# Patient Record
Sex: Male | Born: 1985 | Race: White | Hispanic: No | Marital: Single | Smoking: Current every day smoker
Health system: Southern US, Community
[De-identification: ages and names within clinical notes are randomized; demographics above are authoritative.]

## PROBLEM LIST (undated history)

## (undated) DIAGNOSIS — N200 Calculus of kidney: Secondary | ICD-10-CM

---

## 2016-05-12 ENCOUNTER — Emergency Department (HOSPITAL_COMMUNITY)
Admission: EM | Admit: 2016-05-12 | Discharge: 2016-05-12 | Disposition: A | Discharge status: Home / Self Care | Payer: Self-pay | Attending: Emergency Medicine | Admitting: Emergency Medicine

## 2016-05-12 ENCOUNTER — Emergency Department (HOSPITAL_COMMUNITY): Payer: Self-pay

## 2016-05-12 ENCOUNTER — Encounter (HOSPITAL_COMMUNITY): Payer: Self-pay | Admitting: Emergency Medicine

## 2016-05-12 DIAGNOSIS — F17200 Nicotine dependence, unspecified, uncomplicated: Secondary | ICD-10-CM | POA: Insufficient documentation

## 2016-05-12 DIAGNOSIS — Z79899 Other long term (current) drug therapy: Secondary | ICD-10-CM | POA: Insufficient documentation

## 2016-05-12 DIAGNOSIS — R109 Unspecified abdominal pain: Secondary | ICD-10-CM | POA: Insufficient documentation

## 2016-05-12 DIAGNOSIS — R319 Hematuria, unspecified: Secondary | ICD-10-CM | POA: Insufficient documentation

## 2016-05-12 HISTORY — DX: Calculus of kidney: N20.0

## 2016-05-12 LAB — BASIC METABOLIC PANEL
ANION GAP: 7 (ref 5–15)
BUN: 16 mg/dL (ref 6–20)
CALCIUM: 9.3 mg/dL (ref 8.9–10.3)
CHLORIDE: 105 mmol/L (ref 101–111)
CO2: 26 mmol/L (ref 22–32)
Creatinine, Ser: 0.74 mg/dL (ref 0.61–1.24)
GFR calc non Af Amer: >60 mL/min (ref >60)
GLUCOSE: 101 mg/dL — AB (ref 65–99)
Potassium: 3.7 mmol/L (ref 3.5–5.1)
Sodium: 138 mmol/L (ref 135–145)

## 2016-05-12 LAB — URINALYSIS, ROUTINE W REFLEX MICROSCOPIC
BACTERIA UA: NONE SEEN
Bilirubin Urine: NEGATIVE
GLUCOSE, UA: NEGATIVE mg/dL
Ketones, ur: NEGATIVE mg/dL
Leukocytes, UA: NEGATIVE
Nitrite: NEGATIVE
PROTEIN: NEGATIVE mg/dL
SPECIFIC GRAVITY, URINE: 1.024 (ref 1.005–1.030)
pH: 6 (ref 5.0–8.0)

## 2016-05-12 LAB — CBC
HEMATOCRIT: 43.8 % (ref 39.0–52.0)
HEMOGLOBIN: 15.2 g/dL (ref 13.0–17.0)
MCH: 30.7 pg (ref 26.0–34.0)
MCHC: 34.7 g/dL (ref 30.0–36.0)
MCV: 88.5 fL (ref 78.0–100.0)
Platelets: 330 10*3/uL (ref 150–400)
RBC: 4.95 MIL/uL (ref 4.22–5.81)
RDW: 12.7 % (ref 11.5–15.5)
WBC: 10.4 10*3/uL (ref 4.0–10.5)

## 2016-05-12 MED ORDER — KETOROLAC TROMETHAMINE 30 MG/ML IJ SOLN
15.0000 mg | Freq: Once | INTRAMUSCULAR | Status: AC
  Administered 2016-05-12: 15 mg via INTRAMUSCULAR
  Filled 2016-05-12: qty 1

## 2016-05-12 MED ORDER — NAPROXEN 500 MG PO TABS: 500.0000 mg | ORAL_TABLET | Freq: Two times a day (BID) | ORAL | 0 refills | Status: AC

## 2016-05-12 NOTE — ED Provider Notes (Signed)
[facility]-EMERGENCY DEPT Provider Note   CSN: [id number] Arrival date & time: 05/12/16  1502     History   Chief Complaint Chief Complaint  Patient presents with  . Flank Pain    HPI Carl Terrell is a 31 y.o. male.  HPI  Patient presents with concern of right-sided flank pain. Onset was yesterday, without clear precipitant. Since yesterday the patient had crampy, intermittent pain in the right flank, radiating towards the right inguinal crease. No scrotal changes, the penis pain. Minimal dysuria, no gross hematuria. Pain is not relieved with anything, is brief, eases spontaneously. Patient states that he may have a history of kidney stone in the distant past, denies history of kidney infection or other infections. Patient states that he was well prior to the onset of this illness.   Past Medical History:  Diagnosis Date  . Kidney calculi     There are no active problems to display for this patient.   History reviewed. No pertinent surgical history.     Home Medications    Prior to Admission medications   Not on File    Family History History reviewed. No pertinent family history.  Social History Social History  Substance Use Topics  . Smoking status: Current Every Day Smoker    Packs/day: 0.50  . Smokeless tobacco: Never Used  . Alcohol use No     Allergies   Ceclor [cefaclor]   Review of Systems Review of Systems  Constitutional:       Per HPI, otherwise negative  HENT:       Per HPI, otherwise negative  Respiratory:       Per HPI, otherwise negative  Cardiovascular:       Per HPI, otherwise negative  Gastrointestinal: Negative for vomiting.  Endocrine:       Negative aside from HPI  Genitourinary:       Neg aside from HPI   Musculoskeletal:       Per HPI, otherwise negative  Skin: Negative.   Neurological: Negative for syncope.     Physical Exam Updated Vital Signs BP 107/69 (BP Location: Left Arm)   Pulse 73   Temp 98.6 F (37  C) (Oral)   Resp 18   Ht 5\' 7"  (1.702 m)   Wt 169 lb (76.7 kg)   SpO2 99%   BMI 26.47 kg/m   Physical Exam  Constitutional: He is oriented to person, place, and time. He appears well-developed. No distress.  HENT:  Head: Normocephalic and atraumatic.  Eyes: Conjunctivae and EOM are normal.  Cardiovascular: Normal rate and regular rhythm.   Pulmonary/Chest: Effort normal. No stridor. No respiratory distress.  Abdominal: He exhibits no distension. There is no tenderness.  Musculoskeletal: He exhibits no edema.  Neurological: He is alert and oriented to person, place, and time.  Skin: Skin is warm and dry.  Psychiatric: He has a normal mood and affect.  Nursing note and vitals reviewed.    ED Treatments / Results  Labs (all labs ordered are listed, but only abnormal results are displayed) Labs Reviewed  URINALYSIS, ROUTINE W REFLEX MICROSCOPIC - Abnormal; Notable for the following:       Result Value   APPearance HAZY (*)    Hgb urine dipstick LARGE (*)    All other components within normal limits  BASIC METABOLIC PANEL - Abnormal; Notable for the following:    Glucose, Bld 101 (*)    All other components within normal limits  CBC    Radiology  Ct Renal Stone Study  Result Date: 05/12/2016 CLINICAL DATA:  Right-sided flank pain with urinary retention. History of renal stones. EXAM: CT ABDOMEN AND PELVIS WITHOUT CONTRAST TECHNIQUE: Multidetector CT imaging of the abdomen and pelvis was performed following the standard protocol without IV contrast. COMPARISON:  Prior CT and abdomen radiographs from 12/31/2010 have no archived images for comparison. FINDINGS: Lower chest: No acute abnormality. Dependent atelectasis at each lung base. Hepatobiliary: No focal liver abnormality is seen. No gallstones, gallbladder wall thickening, or biliary dilatation. Pancreas: Unremarkable. No pancreatic ductal dilatation or surrounding inflammatory changes. Spleen: Normal in size without focal  abnormality. Adrenals/Urinary Tract: Adrenal glands are unremarkable. Kidneys are normal, without renal calculi, focal lesion, or hydronephrosis. Bladder is unremarkable. Stomach/Bowel: Stomach is within normal limits. Appendix appears normal. No evidence of bowel wall thickening, distention, or inflammatory changes. Large amount of fecal rib residue noted within large bowel from cecum through splenic flexure. Vascular/Lymphatic: No significant vascular findings are present. No enlarged abdominal or pelvic lymph nodes. Several small mesenteric lymph nodes are identified in the right lower quadrant adjacent to the cecum consistent with mesenteric adenitis. Reproductive: Prostate is unremarkable. Other: Tiny fat containing umbilical hernia. No abdominopelvic ascites or pneumoperitoneum. Musculoskeletal: Mild broad-based central disc herniation with disc space narrowing at L5-S1. No acute osseous abnormality. IMPRESSION: 1. Small right lower quadrant mesenteric lymph nodes in keeping with mesenteric adenitis. No nephrolithiasis nor obstructive uropathy. 2. Mild broad-based central disc herniation L5-S1. 3. Moderate fecal retention from cecum through the splenic flexure. Electronically Signed   By: Tollie Eth M.D.   On: 05/12/2016 19:05    Procedures Procedures (including critical care time)  Medications Ordered in ED Medications  ketorolac (TORADOL) 30 MG/ML injection 15 mg (15 mg Intramuscular Given 05/12/16 1854)     Initial Impression / Assessment and Plan / ED Course  I have reviewed the triage vital signs and the nursing notes.  Pertinent labs & imaging results that were available during my care of the patient were reviewed by me and considered in my medical decision making (see chart for details).  Update: On repeat exam the patient is in no distress. I discussed all findings with patient and his girlfriend. With this history, including hematuria, suspicion is for a passed kidney stone. CT  interpreted as possible mesenteric adenitis, patient has no other abdominal pain, minimal discomfort.  This is likely reactive. Patient understands all findings, will start anti-inflammatories, follow up with primary care next week.  Final Clinical Impressions(s) / ED Diagnoses  Abdominal pain  Hematuria   Gerhard Munch, MD 05/12/16 2022

## 2016-05-12 NOTE — Discharge Instructions (Signed)
As discussed, your evaluation today has been largely reassuring.  But, it is important that you monitor your condition carefully, and do not hesitate to return to the ED if you develop new, or concerning changes in your condition.  Your symptoms are likely due to a recently passed kidney stone.  Please follow-up with your physician for appropriate ongoing care.

## 2016-05-12 NOTE — ED Triage Notes (Signed)
PT c/o right sided flank pain with some urinary retention that started x1 day ago.

## 2016-10-19 ENCOUNTER — Emergency Department (HOSPITAL_COMMUNITY): Payer: PRIVATE HEALTH INSURANCE

## 2016-10-19 ENCOUNTER — Encounter (HOSPITAL_COMMUNITY): Payer: Self-pay | Admitting: Cardiology

## 2016-10-19 ENCOUNTER — Emergency Department (HOSPITAL_COMMUNITY)
Admission: EM | Admit: 2016-10-19 | Discharge: 2016-10-19 | Disposition: A | Discharge status: Home / Self Care | Payer: PRIVATE HEALTH INSURANCE | Attending: Emergency Medicine | Admitting: Emergency Medicine

## 2016-10-19 DIAGNOSIS — Y929 Unspecified place or not applicable: Secondary | ICD-10-CM | POA: Insufficient documentation

## 2016-10-19 DIAGNOSIS — Y999 Unspecified external cause status: Secondary | ICD-10-CM | POA: Insufficient documentation

## 2016-10-19 DIAGNOSIS — T1490XA Injury, unspecified, initial encounter: Secondary | ICD-10-CM

## 2016-10-19 DIAGNOSIS — F17210 Nicotine dependence, cigarettes, uncomplicated: Secondary | ICD-10-CM | POA: Diagnosis not present

## 2016-10-19 DIAGNOSIS — Y939 Activity, unspecified: Secondary | ICD-10-CM | POA: Insufficient documentation

## 2016-10-19 DIAGNOSIS — S9031XA Contusion of right foot, initial encounter: Secondary | ICD-10-CM | POA: Diagnosis not present

## 2016-10-19 DIAGNOSIS — S99921A Unspecified injury of right foot, initial encounter: Secondary | ICD-10-CM | POA: Diagnosis present
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
## 2016-10-19 NOTE — Discharge Instructions (Signed)
Your xray is negative for fracture or dislocation. Please apply ice to your foot today and tomorrow. Keep your foot elevated above your waist. Use the postoperative shoe until you can safely use your regular shoes. Please see Dr. Romeo AppleHarrison for orthopedic evaluation if not improving.

## 2017-04-03 ENCOUNTER — Emergency Department (HOSPITAL_COMMUNITY): Payer: No Typology Code available for payment source

## 2017-04-03 ENCOUNTER — Encounter (HOSPITAL_COMMUNITY): Payer: Self-pay

## 2017-04-03 ENCOUNTER — Other Ambulatory Visit: Payer: Self-pay

## 2017-04-03 ENCOUNTER — Emergency Department (HOSPITAL_COMMUNITY)
Admission: EM | Admit: 2017-04-03 | Discharge: 2017-04-03 | Disposition: A | Discharge status: Home / Self Care | Payer: No Typology Code available for payment source | Attending: Emergency Medicine | Admitting: Emergency Medicine

## 2017-04-03 DIAGNOSIS — Y999 Unspecified external cause status: Secondary | ICD-10-CM | POA: Insufficient documentation

## 2017-04-03 DIAGNOSIS — S4991XA Unspecified injury of right shoulder and upper arm, initial encounter: Secondary | ICD-10-CM | POA: Insufficient documentation

## 2017-04-03 DIAGNOSIS — Y9389 Activity, other specified: Secondary | ICD-10-CM | POA: Diagnosis not present

## 2017-04-03 DIAGNOSIS — F17210 Nicotine dependence, cigarettes, uncomplicated: Secondary | ICD-10-CM | POA: Insufficient documentation

## 2017-04-03 DIAGNOSIS — Y9289 Other specified places as the place of occurrence of the external cause: Secondary | ICD-10-CM | POA: Diagnosis not present

## 2017-04-03 MED ORDER — MELOXICAM 15 MG PO TABS: 15.0000 mg | ORAL_TABLET | Freq: Every day | ORAL | 0 refills | Status: AC

## 2017-04-03 NOTE — Discharge Instructions (Signed)
Ice your shoulder several times a day. Mobic for pain and inflammation. No heavy lifting or strenuous activity for 1-2 weeks. Follow up with family doctor or sports medicine if not improving.
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
## 2017-04-03 NOTE — ED Notes (Signed)
Patient transported to X-ray 

## 2017-04-15 ENCOUNTER — Emergency Department (HOSPITAL_COMMUNITY)
Admission: EM | Admit: 2017-04-15 | Discharge: 2017-04-15 | Disposition: A | Discharge status: Home / Self Care | Payer: Self-pay | Attending: Emergency Medicine | Admitting: Emergency Medicine

## 2017-04-15 ENCOUNTER — Other Ambulatory Visit: Payer: Self-pay

## 2017-04-15 ENCOUNTER — Encounter (HOSPITAL_COMMUNITY): Payer: Self-pay | Admitting: *Deleted

## 2017-04-15 ENCOUNTER — Emergency Department (HOSPITAL_COMMUNITY): Payer: Self-pay

## 2017-04-15 DIAGNOSIS — J101 Influenza due to other identified influenza virus with other respiratory manifestations: Secondary | ICD-10-CM | POA: Insufficient documentation

## 2017-04-15 DIAGNOSIS — F17200 Nicotine dependence, unspecified, uncomplicated: Secondary | ICD-10-CM | POA: Insufficient documentation

## 2017-04-15 DIAGNOSIS — J111 Influenza due to unidentified influenza virus with other respiratory manifestations: Secondary | ICD-10-CM

## 2017-04-15 LAB — INFLUENZA PANEL BY PCR (TYPE A & B)
INFLBPCR: NEGATIVE
Influenza A By PCR: POSITIVE — AB

## 2017-04-15 MED ORDER — BENZONATATE 100 MG PO CAPS
200.0000 mg | ORAL_CAPSULE | Freq: Once | ORAL | Status: AC
  Administered 2017-04-15: 200 mg via ORAL
  Filled 2017-04-15: qty 2

## 2017-04-15 MED ORDER — BENZONATATE 100 MG PO CAPS: 200.0000 mg | ORAL_CAPSULE | Freq: Three times a day (TID) | ORAL | 0 refills | Status: AC | PRN

## 2017-04-15 NOTE — Discharge Instructions (Signed)
Rest,  Drink plenty of fluids.  Take motrin or tylenol for achiness and fever reduction.   Get rechecked for increased shortness of breath,  Increased fever or increasing weakness. ° °

## 2017-04-15 NOTE — ED Provider Notes (Signed)
[facility] EMERGENCY DEPARTMENT Provider Note   CSN: [id number] Arrival date & time: 04/15/17  1952     History   Chief Complaint Chief Complaint  Patient presents with  . Fever    HPI Carl Terrell is a 32 y.o. male presenting with a 4 day history of flulike symptoms including intermittent fever with maximum temperature of 102.4, nonproductive cough, nasal congestion and nausea with emesis, stating that he has had 3 episodes of vomiting today.  He denies abdominal pain, chest pain, shortness of breath and diarrhea.  He suspected he had the flu and stated he was actually feeling better when he woke this morning but then this afternoon his fever returned along with worsening cough.  He has taken an OTC cough syrup along with ibuprofen with transient relief of symptoms.  He has not had any known exposure to the flu nor is he had his flu shot.  The history is provided by the patient.    Past Medical History:  Diagnosis Date  . Kidney calculi     There are no active problems to display for this patient.   History reviewed. No pertinent surgical history.     Home Medications    Prior to Admission medications   Medication Sig Start Date End Date Taking? Authorizing Provider  benzonatate (TESSALON) 100 MG capsule Take 2 capsules (200 mg total) by mouth 3 (three) times daily as needed. 04/15/17   Burgess Amor, PA-C  buprenorphine-naloxone (SUBOXONE) 8-2 mg SUBL SL tablet Place 1 tablet under the tongue daily.    [provider]  meloxicam (MOBIC) 15 MG tablet Take 1 tablet (15 mg total) by mouth daily. 04/03/17   Jaynie Crumble, PA-C    Family History No family history on file.  Social History Social History   Tobacco Use  . Smoking status: Current Every Day Smoker    Packs/day: 0.50  . Smokeless tobacco: Never Used  Substance Use Topics  . Alcohol use: No  . Drug use: No     Allergies   Ceclor [cefaclor]   Review of Systems Review of Systems    Constitutional: Positive for chills. Negative for fever.  HENT: Positive for congestion and rhinorrhea. Negative for sore throat.   Eyes: Negative.   Respiratory: Positive for cough. Negative for chest tightness and shortness of breath.   Cardiovascular: Negative for chest pain.  Gastrointestinal: Positive for nausea and vomiting. Negative for abdominal pain and diarrhea.  Genitourinary: Negative.   Musculoskeletal: Negative for arthralgias, joint swelling and neck pain.  Skin: Negative.  Negative for rash and wound.  Neurological: Negative for dizziness, weakness, light-headedness, numbness and headaches.  Psychiatric/Behavioral: Negative.      Physical Exam Updated Vital Signs BP 124/77 (BP Location: Right Arm)   Pulse (!) 102   Temp 99.5 F (37.5 C) (Oral)   Resp 17   Ht 5\' 7"  (1.702 m)   Wt 79.4 kg (175 lb)   SpO2 100%   BMI 27.41 kg/m   Physical Exam  Constitutional: He is oriented to person, place, and time. He appears well-developed and well-nourished.  HENT:  Head: Normocephalic and atraumatic.  Right Ear: Tympanic membrane and ear canal normal.  Left Ear: Tympanic membrane and ear canal normal.  Nose: Mucosal edema and rhinorrhea present.  Mouth/Throat: Uvula is midline, oropharynx is clear and moist and mucous membranes are normal. No oropharyngeal exudate, posterior oropharyngeal edema, posterior oropharyngeal erythema or tonsillar abscesses. No tonsillar exudate.  Eyes: Conjunctivae are normal.  Cardiovascular:  Normal heart sounds. Tachycardia present.  Pulmonary/Chest: Effort normal. No respiratory distress. He has no wheezes. He has rhonchi in the left lower field. He has no rales.  Abdominal: Soft. Bowel sounds are normal. There is no tenderness. There is no rebound and no guarding.  Musculoskeletal: Normal range of motion.  Neurological: He is alert and oriented to person, place, and time.  Skin: Skin is warm and dry. No rash noted.  Psychiatric: He has a  normal mood and affect.     ED Treatments / Results  Labs (all labs ordered are listed, but only abnormal results are displayed) Labs Reviewed  INFLUENZA PANEL BY PCR (TYPE A & B) - Abnormal; Notable for the following components:      Result Value   Influenza A By PCR POSITIVE (*)    All other components within normal limits    EKG  EKG Interpretation None       Radiology Dg Chest 2 View  Result Date: 04/15/2017 CLINICAL DATA:  Cough and fever EXAM: CHEST  2 VIEW COMPARISON:  None. FINDINGS: Lungs are clear. Heart size and pulmonary vascularity are normal. No adenopathy. No bone lesions. IMPRESSION: No edema or consolidation. Electronically Signed   By: Bretta BangWilliam  Woodruff III M.D.   On: 04/15/2017 21:39    Procedures Procedures (including critical care time)  Medications Ordered in ED Medications  benzonatate (TESSALON) capsule 200 mg (200 mg Oral Given 04/15/17 2059)     Initial Impression / Assessment and Plan / ED Course  I have reviewed the triage vital signs and the nursing notes.  Pertinent labs & imaging results that were available during my care of the patient were reviewed by me and considered in my medical decision making (see chart for details).     Patient with influenza with symptoms first occurring 4 days ago.  Given worsened symptoms today including higher fever and increased cough he was sent for chest x-ray which was clear without evidence for pneumonia.  Discussed home supportive care.  Also discussed pros and cons of Tamiflu and the unlikely benefit of this medication given had his symptoms now for greater than 48 hours.  He defers this medication.  PRN follow-up anticipated, strict return precautions discussed.  Final Clinical Impressions(s) / ED Diagnoses   Final diagnoses:  Influenza    ED Discharge Orders        Ordered    benzonatate (TESSALON) 100 MG capsule  3 times daily PRN     04/15/17 2204       Burgess Amordol, Falana Clagg, PA-C 04/15/17 2214      Vanetta MuldersZackowski, Scott, MD 04/17/17 [phone]

## 2017-04-15 NOTE — ED Triage Notes (Signed)
Pt reports cough, congestion, and low-grade fever (99.6) since Tuesday. Pt reports temp of 102.4, sore throat, and worsening cough since yesterday.

## 2017-07-22 ENCOUNTER — Emergency Department (HOSPITAL_COMMUNITY): Admission: EM | Admit: 2017-07-22 | Discharge: 2017-07-22 | Discharge status: Against Medical Advice | Payer: Self-pay

## 2017-07-22 NOTE — ED Notes (Signed)
No answer

## 2017-07-22 NOTE — ED Notes (Signed)
Called patient to triage x 3. No answer.
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
